# Patient Record
Sex: Female | Born: 1950 | Race: Black or African American | Hispanic: No | Smoking: Never smoker
Health system: Southern US, Community
[De-identification: ages and names within clinical notes are randomized; demographics above are authoritative.]

## PROBLEM LIST (undated history)

## (undated) DIAGNOSIS — J45909 Unspecified asthma, uncomplicated: Secondary | ICD-10-CM

## (undated) DIAGNOSIS — E079 Disorder of thyroid, unspecified: Secondary | ICD-10-CM

## (undated) DIAGNOSIS — H409 Unspecified glaucoma: Secondary | ICD-10-CM

## (undated) DIAGNOSIS — K219 Gastro-esophageal reflux disease without esophagitis: Secondary | ICD-10-CM

## (undated) DIAGNOSIS — I10 Essential (primary) hypertension: Secondary | ICD-10-CM

## (undated) DIAGNOSIS — E119 Type 2 diabetes mellitus without complications: Secondary | ICD-10-CM

---

## 1998-04-10 ENCOUNTER — Other Ambulatory Visit: Admission: RE | Admit: 1998-04-10 | Discharge: 1998-04-10 | Payer: Self-pay | Admitting: Obstetrics and Gynecology

## 1998-10-28 HISTORY — PX: OVARY SURGERY: SHX727

## 1999-03-09 ENCOUNTER — Other Ambulatory Visit: Admission: RE | Admit: 1999-03-09 | Discharge: 1999-03-09 | Payer: Self-pay | Admitting: Obstetrics and Gynecology

## 1999-04-04 ENCOUNTER — Other Ambulatory Visit: Admission: RE | Admit: 1999-04-04 | Discharge: 1999-04-04 | Payer: Self-pay | Admitting: Obstetrics and Gynecology

## 1999-04-13 ENCOUNTER — Ambulatory Visit (HOSPITAL_COMMUNITY): Admission: RE | Admit: 1999-04-13 | Discharge: 1999-04-13 | Payer: Self-pay | Admitting: Obstetrics and Gynecology

## 1999-05-22 ENCOUNTER — Encounter: Payer: Self-pay | Admitting: Obstetrics and Gynecology

## 1999-05-22 ENCOUNTER — Ambulatory Visit (HOSPITAL_COMMUNITY): Admission: RE | Admit: 1999-05-22 | Discharge: 1999-05-22 | Payer: Self-pay | Admitting: Obstetrics and Gynecology

## 1999-08-07 ENCOUNTER — Encounter (INDEPENDENT_AMBULATORY_CARE_PROVIDER_SITE_OTHER): Payer: Self-pay | Admitting: Specialist

## 1999-08-07 ENCOUNTER — Observation Stay (HOSPITAL_COMMUNITY): Admission: RE | Admit: 1999-08-07 | Discharge: 1999-08-07 | Payer: Self-pay | Admitting: Obstetrics and Gynecology

## 1999-11-22 ENCOUNTER — Encounter: Payer: Self-pay | Admitting: Obstetrics and Gynecology

## 1999-11-22 ENCOUNTER — Ambulatory Visit (HOSPITAL_COMMUNITY): Admission: RE | Admit: 1999-11-22 | Discharge: 1999-11-22 | Payer: Self-pay | Admitting: Obstetrics and Gynecology

## 2000-03-21 ENCOUNTER — Other Ambulatory Visit: Admission: RE | Admit: 2000-03-21 | Discharge: 2000-03-21 | Payer: Self-pay | Admitting: Obstetrics and Gynecology

## 2001-03-23 ENCOUNTER — Other Ambulatory Visit: Admission: RE | Admit: 2001-03-23 | Discharge: 2001-03-23 | Payer: Self-pay | Admitting: Obstetrics and Gynecology

## 2001-07-01 ENCOUNTER — Encounter: Payer: Self-pay | Admitting: Cardiology

## 2001-07-01 ENCOUNTER — Ambulatory Visit (HOSPITAL_COMMUNITY): Admission: RE | Admit: 2001-07-01 | Discharge: 2001-07-01 | Payer: Self-pay | Admitting: Cardiology

## 2002-03-24 ENCOUNTER — Other Ambulatory Visit: Admission: RE | Admit: 2002-03-24 | Discharge: 2002-03-24 | Payer: Self-pay | Admitting: Obstetrics and Gynecology

## 2002-05-21 ENCOUNTER — Encounter: Admission: RE | Admit: 2002-05-21 | Discharge: 2002-08-19 | Payer: Self-pay | Admitting: Cardiology

## 2002-10-12 ENCOUNTER — Encounter: Admission: RE | Admit: 2002-10-12 | Discharge: 2003-01-10 | Payer: Self-pay | Admitting: Cardiology

## 2003-01-24 ENCOUNTER — Encounter: Admission: RE | Admit: 2003-01-24 | Discharge: 2003-04-24 | Payer: Self-pay | Admitting: Cardiology

## 2003-03-23 ENCOUNTER — Other Ambulatory Visit: Admission: RE | Admit: 2003-03-23 | Discharge: 2003-03-23 | Payer: Self-pay | Admitting: Obstetrics and Gynecology

## 2004-06-29 ENCOUNTER — Ambulatory Visit (HOSPITAL_COMMUNITY): Admission: RE | Admit: 2004-06-29 | Discharge: 2004-06-29 | Payer: Self-pay | Admitting: Gastroenterology

## 2005-02-15 ENCOUNTER — Encounter: Admission: RE | Admit: 2005-02-15 | Discharge: 2005-02-15 | Payer: Self-pay | Admitting: Cardiology

## 2006-02-26 ENCOUNTER — Encounter: Payer: Self-pay | Admitting: Obstetrics and Gynecology

## 2007-03-18 ENCOUNTER — Ambulatory Visit (HOSPITAL_COMMUNITY): Admission: RE | Admit: 2007-03-18 | Discharge: 2007-03-18 | Payer: Self-pay | Admitting: Family Medicine

## 2007-04-08 ENCOUNTER — Encounter: Admission: RE | Admit: 2007-04-08 | Discharge: 2007-04-08 | Payer: Self-pay | Admitting: Family Medicine

## 2007-04-08 ENCOUNTER — Other Ambulatory Visit: Admission: RE | Admit: 2007-04-08 | Discharge: 2007-04-08 | Payer: Self-pay | Admitting: Interventional Radiology

## 2007-04-08 ENCOUNTER — Encounter (INDEPENDENT_AMBULATORY_CARE_PROVIDER_SITE_OTHER): Payer: Self-pay | Admitting: Interventional Radiology

## 2007-11-02 ENCOUNTER — Ambulatory Visit (HOSPITAL_COMMUNITY): Admission: RE | Admit: 2007-11-02 | Discharge: 2007-11-02 | Payer: Self-pay | Admitting: Cardiology

## 2007-11-20 ENCOUNTER — Encounter: Admission: RE | Admit: 2007-11-20 | Discharge: 2007-11-20 | Payer: Self-pay | Admitting: Gastroenterology

## 2007-11-30 ENCOUNTER — Other Ambulatory Visit: Admission: RE | Admit: 2007-11-30 | Discharge: 2007-11-30 | Payer: Self-pay | Admitting: Obstetrics and Gynecology

## 2007-11-30 IMAGING — US US BIOPSY
1 series · 4 of 4 positions shown · non-contrast
Comparison: none

CLINICAL DATA: Comparison is made with ultrasound which was performed on 03/18/07 which revealed bilateral thyroid nodules with the largest being of the isthmus measuring 1.4 cm.  Request has been made with fine needle aspiration. 
ULTRASOUND-GUIDED FINE NEEDLE ASPIRATION OF THYROID:

[Series 1: us biopsy · 4 acquisitions, 4 frames shown]
[im 1/4]
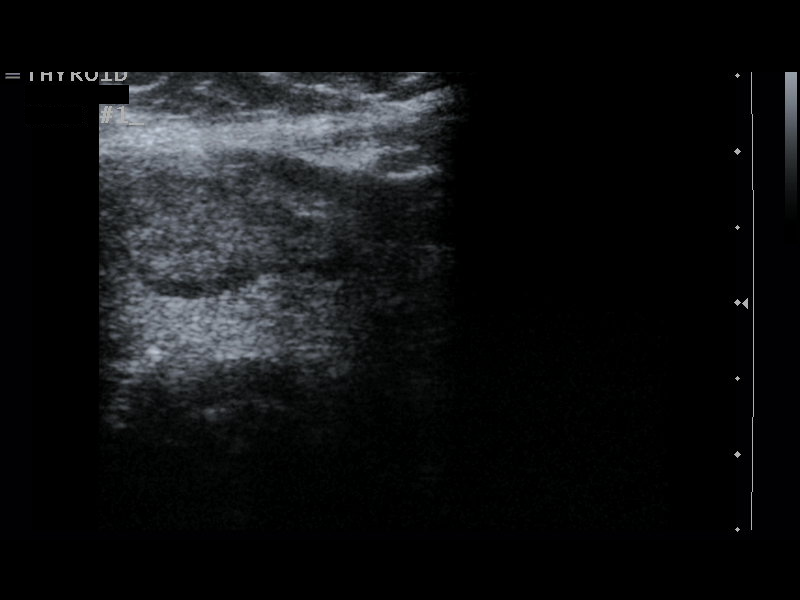
[im 2/4]
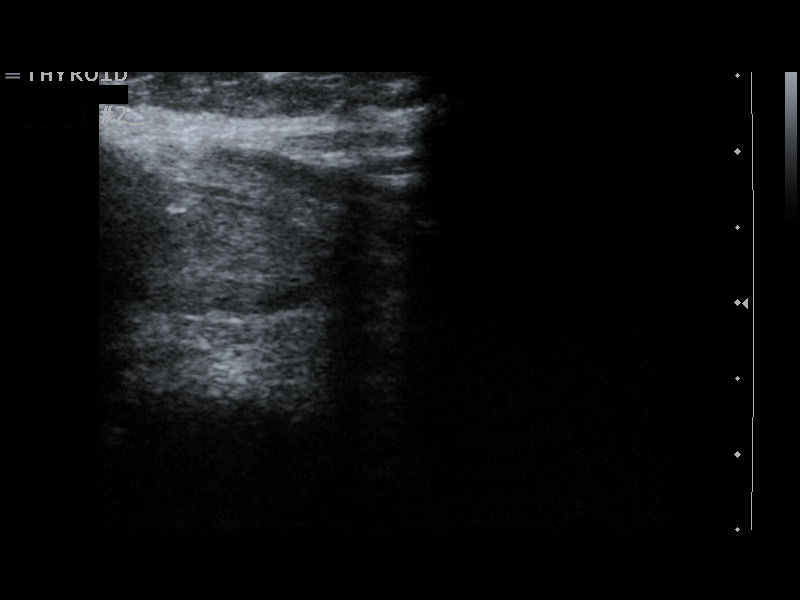
[im 3/4]
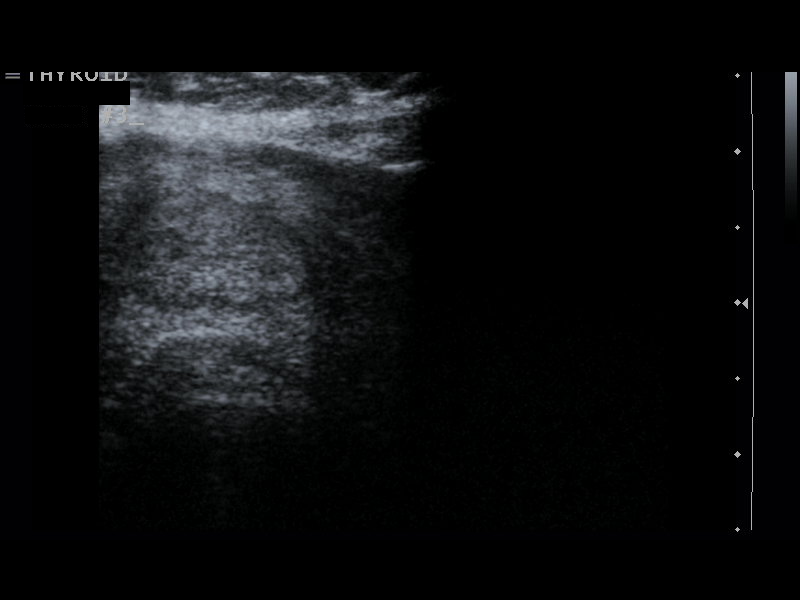
[im 4/4]
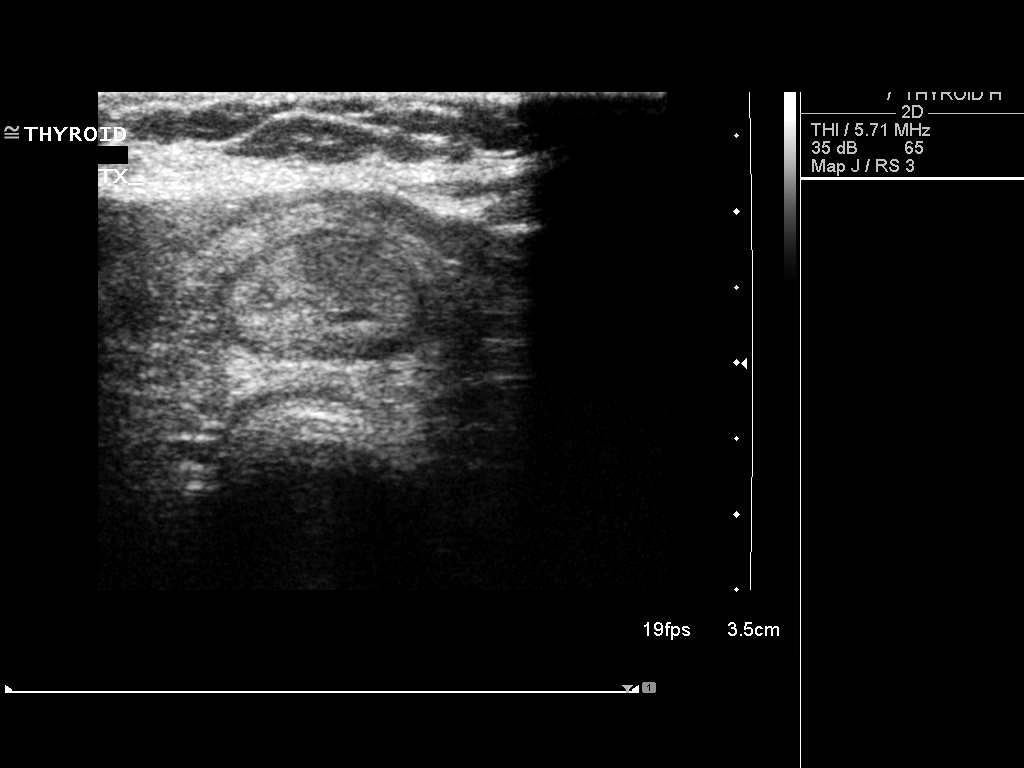

[4 of 4 positions shown; findings below may reference images not displayed]

FINDINGS: The above procedure was thoroughly discussed with the patient and written informed consent was obtained. 
Ultrasound was then performed to localize and mark an adequate site for the biopsy.  The patient was then prepped and draped in a normal sterile fashion.  1% lidocaine was used for local anesthesia.  Using direct ultrasound guidance, three passes were made using a 25 gauge hypodermic needle into the solid nodule of the isthmus portion of the thyroid.  Ultrasound confirmed placement of the needle on all three occasions.  The specimens were given to Pathology for further analysis.  Post procedure imaging demonstrated no hematoma or immediate complication.  The patient tolerated the procedure well.
IMPRESSION: Successful ultrasound-guided fine needle aspiration of isthmus nodule of the thyroid.  Final pathology pending.

## 2008-04-07 ENCOUNTER — Ambulatory Visit (HOSPITAL_COMMUNITY): Admission: RE | Admit: 2008-04-07 | Discharge: 2008-04-07 | Payer: Self-pay | Admitting: Internal Medicine

## 2008-11-29 IMAGING — US US SOFT TISSUE HEAD/NECK
1 series · 14 of 25 positions shown · non-contrast
Comparison: 11/02/2007 and 03/18/2007

CLINICAL DATA: Follow-up multinodular goiter.

THYROID ULTRASOUND
TECHNIQUE: Ultrasound examination of the thyroid gland and all
adjacent soft tissues was performed.

[Series 1: unknown · 0.11mm/px · 14 of 47 slices shown]
[im 1/47]
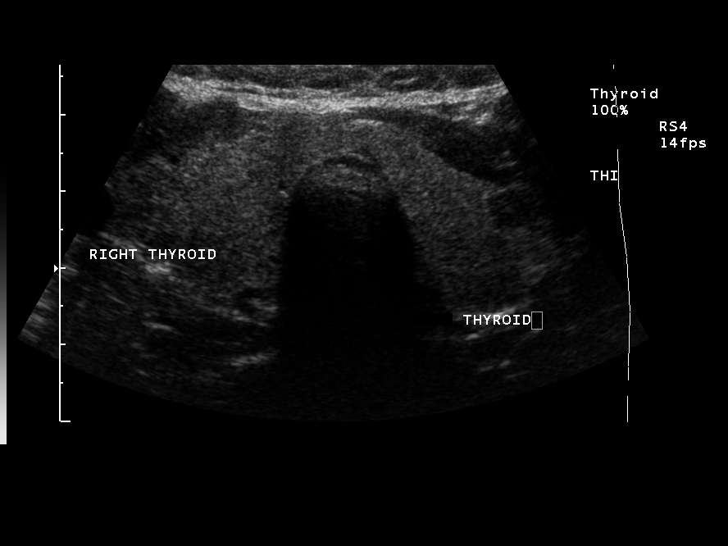
[im 4/47]
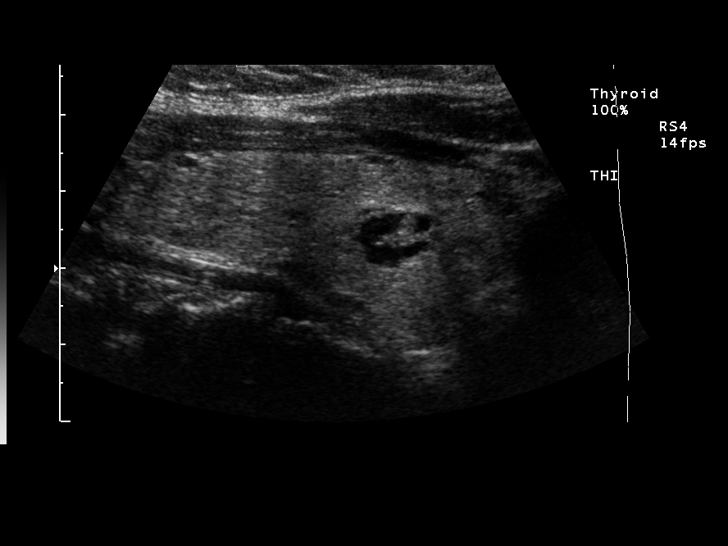
[im 8/47]
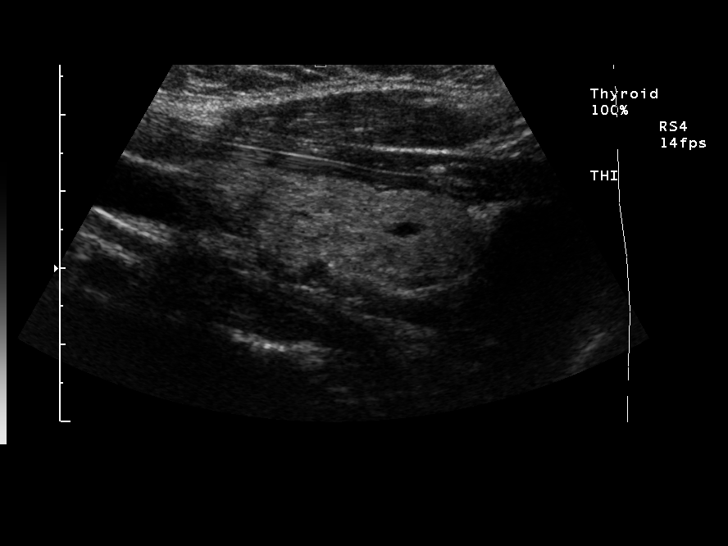
[im 12/47]
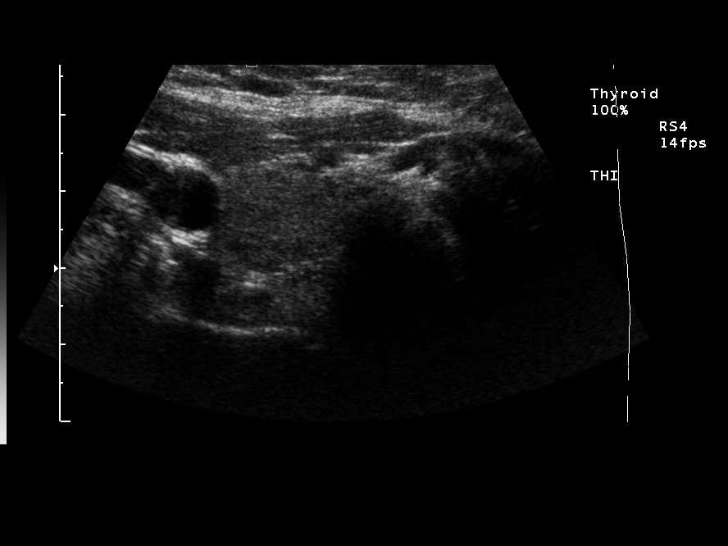
[im 16/47]
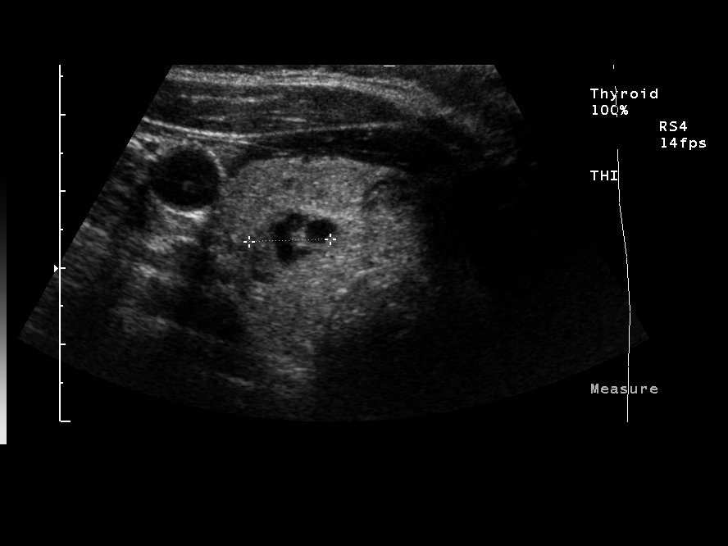
[im 18/47]
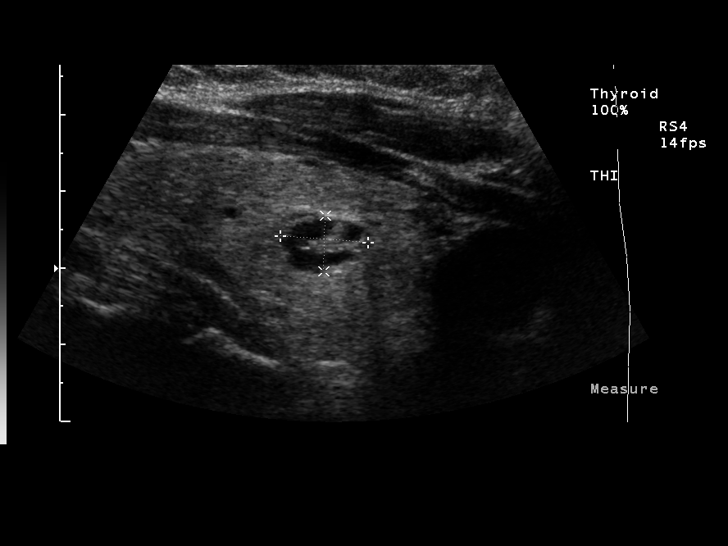
[im 22/47]
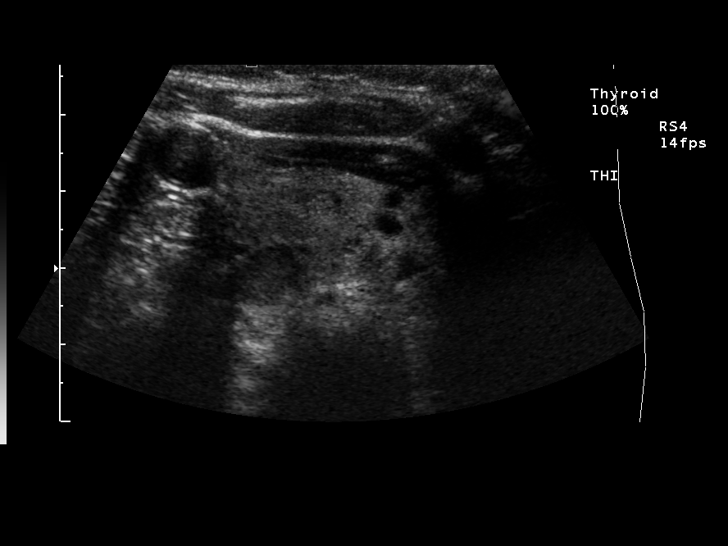
[im 25/47]
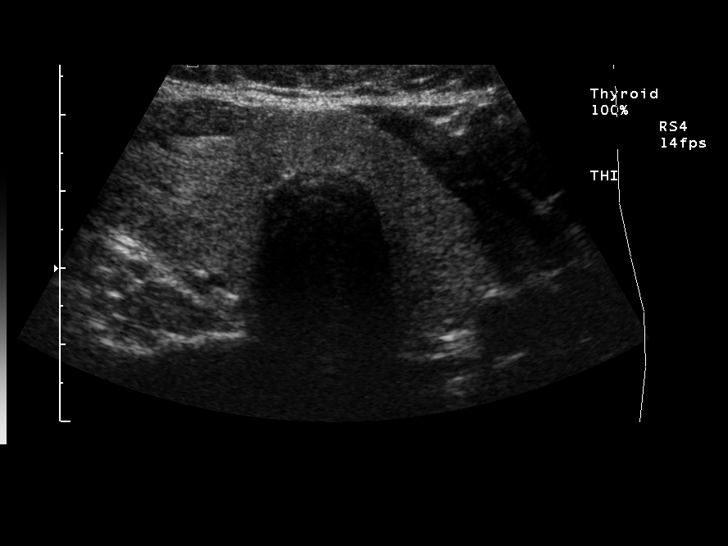
[im 29/47]
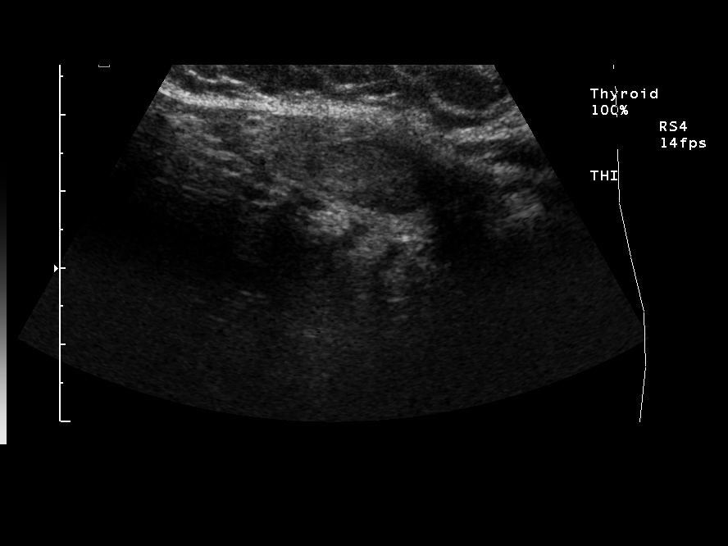
[im 31/47]
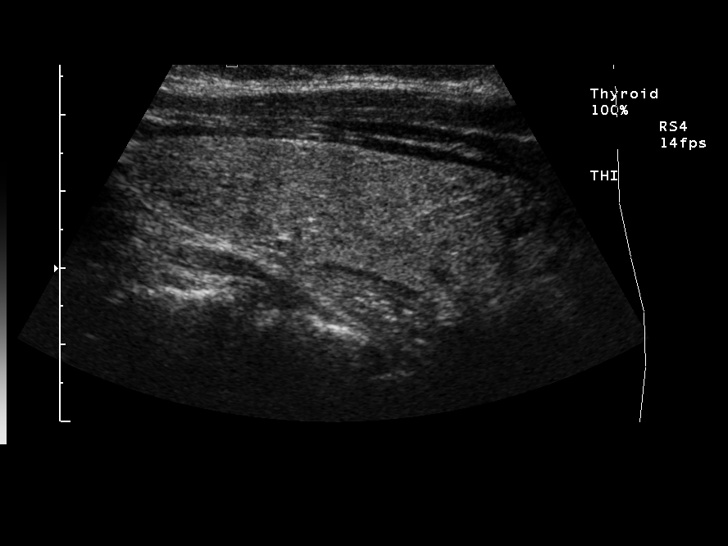
[im 35/47]
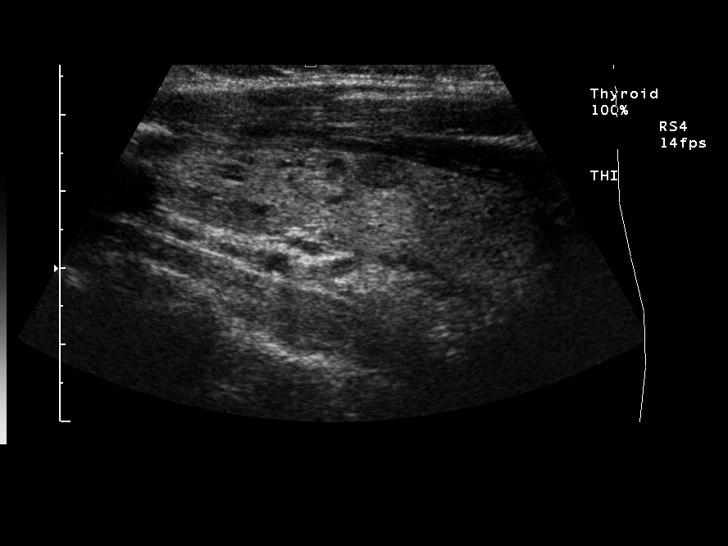
[im 39/47]
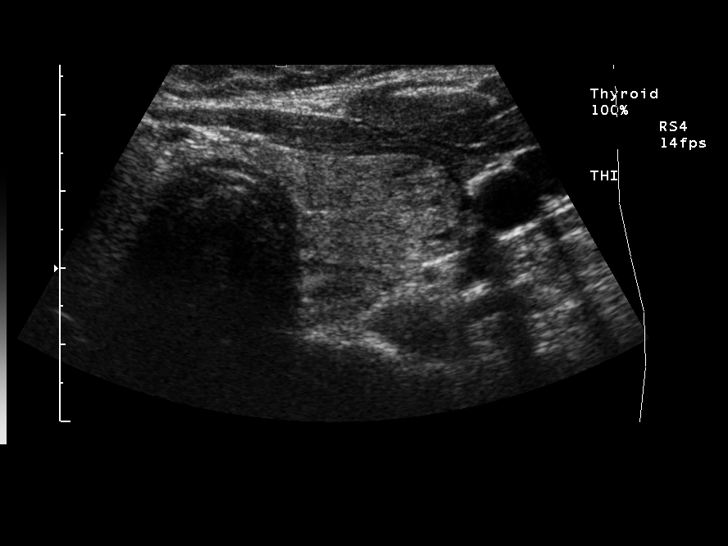
[im 43/47]
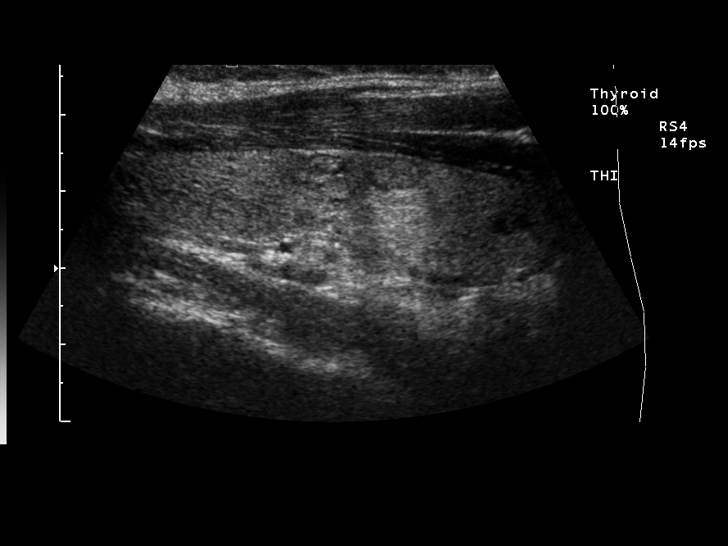
[im 47/47]
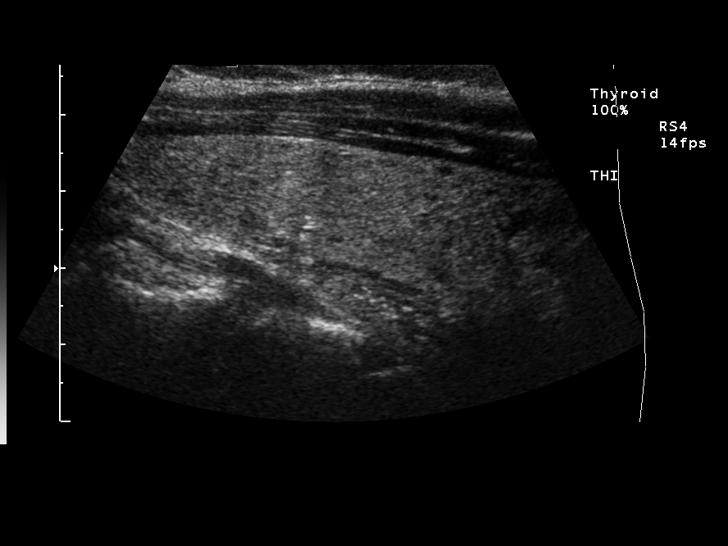

[14 of 25 positions shown; findings below may reference images not displayed]

FINDINGS: Mild goiter is seen.  The right thyroid lobe measures
x 2.0 x 2.0 cm.  The left thyroid lobe measures 5.9 x 2.2 x 2.2 cm.
The thyroid isthmus measures 8 mm in thickness.

Three solid hypoechoic nodules are seen within the inferior right
thyroid lobe measuring 0.8 x 0.9 x 0.7 cm, the mid left thyroid
lobe measuring 0.8 by 0.5 x 0.7 cm, and the thyroid isthmus
measuring 1.1 by 1.7 x 0.9 cm.  These nodules show no evidence of
microcalcifications and are homogeneous with well-defined margins.
A predominately cystic nodule seen in the mid right thyroid lobe
which measures 1.2 x 0.7 x 1.1 cm. These nodules show no
significant change in size or appearance since prior exams.
IMPRESSION: Multinodular goiter, without significant change compared to prior
exams.

## 2011-03-15 NOTE — Op Note (Signed)
NAME:  Pam Wong, Pam Wong                        ACCOUNT NO.:  [id number]   MEDICAL RECORD NO.:  [id number]                   PATIENT TYPE:  AMB   LOCATION:  ENDO                                 FACILITY:  [facility]   PHYSICIAN:  Anselmo Rod, M.D.               DATE OF BIRTH:  Aug 02, 1951   DATE OF PROCEDURE:  DATE OF DISCHARGE:                                 OPERATIVE REPORT   DATE OF PROCEDURE:  June 29, 2004.   PROCEDURE PERFORMED:  Screening colonoscopy.   ENDOSCOPIST:  Anselmo Rod, MD.   INSTRUMENT USED:  Olympus video colonoscope.   INDICATIONS FOR PROCEDURE:  A 60 year old African-American female with a  family history of colon cancer __________.  Patient undergoing screening  colonoscopy to rule out colonic polyps, masses, etc.   PRE-PROCEDURE PREPARATION:  Informed consent was procured from the patient.  The patient was fasted for 8 hours prior to the procedure and prepped with a  bottle of magnesium citrate and a gallon of GoLYTELY the night prior to the  procedure.   PRE-PROCEDURE PHYSICAL:  VITAL SIGNS:  Stable.  NECK:  Supple.  CHEST:  Clear to auscultation.  S1, S1 regular.  ABDOMEN:  Soft with normal bowel sounds.   DESCRIPTION OF PROCEDURE:  The patient was placed in the left lateral  decubitus position, sedated with 90 mg of Demerol and 10 mg of Versed in  slow incremental doses.  Once the patient was adequately sedated and  maintained on low flow oxygen and continuous cardiac monitoring, the Olympus  video colonoscope was advanced into the rectum to the cecum with great  difficulty.  The patient's position was changed from the left lateral  decubitus position and to the right lateral position, and with gentle  application of abdominal pressure reached the cecum.  The appendiceal  orifice and ileocecal valve were visualized and no masses, polyps, erosions,  ulcerations, or diverticula were seen.  Small internal hemorrhoids were  appreciated on  retroflexion in the rectum.  External hemorrhoids were seen  on anal inspection.  The patient tolerated the procedure well without  complications.  The patient had a very prolonged procedure and difficult  procedure because of her body habitus and difficulty in turning her, and  there was a significant amount of residual stool in the colon and small  lesions could have been missed.   IMPRESSION:  1.  Essentially unrevealing colonoscopy up to the cecum.  A large amount of      residual stool in the colon, small lesions could have been missed.  2.  Small internal hemorrhoids seen on retroflexion.  3.  Small external hemorrhoids seen on anal inspection.   RECOMMENDATIONS:  1.  Repeat colonoscopy has been recommended in the next 3 years unless the      patient develops any abdominal      symptoms in the interim.  2.  High fiber diet  with liberal fluid intake.  3.  Outpatient followup in the next 2 weeks for further recommendations.                                               Anselmo Rod, M.D.    JNM/MEDQ  D:  06/29/2004  T:  07/01/2004  Job:  [id number]   cc:   Hal Morales, M.D.  Fax: [phone]   Maxie Better, M.D.  [address]  [place]  [place] [address]  Fax: [phone]

## 2015-09-24 ENCOUNTER — Encounter (HOSPITAL_COMMUNITY): Payer: Self-pay | Admitting: Emergency Medicine

## 2015-09-24 ENCOUNTER — Emergency Department (HOSPITAL_COMMUNITY)
Admission: EM | Admit: 2015-09-24 | Discharge: 2015-09-25 | Disposition: A | Discharge status: Home / Self Care | Payer: Medicare PPO | Attending: Emergency Medicine | Admitting: Emergency Medicine

## 2015-09-24 DIAGNOSIS — Z8719 Personal history of other diseases of the digestive system: Secondary | ICD-10-CM | POA: Insufficient documentation

## 2015-09-24 DIAGNOSIS — J45909 Unspecified asthma, uncomplicated: Secondary | ICD-10-CM | POA: Diagnosis not present

## 2015-09-24 DIAGNOSIS — H409 Unspecified glaucoma: Secondary | ICD-10-CM | POA: Diagnosis not present

## 2015-09-24 DIAGNOSIS — R109 Unspecified abdominal pain: Secondary | ICD-10-CM

## 2015-09-24 DIAGNOSIS — Z880 Allergy status to penicillin: Secondary | ICD-10-CM | POA: Insufficient documentation

## 2015-09-24 DIAGNOSIS — F4329 Adjustment disorder with other symptoms: Secondary | ICD-10-CM | POA: Diagnosis present

## 2015-09-24 DIAGNOSIS — Z79899 Other long term (current) drug therapy: Secondary | ICD-10-CM | POA: Diagnosis not present

## 2015-09-24 DIAGNOSIS — F4325 Adjustment disorder with mixed disturbance of emotions and conduct: Secondary | ICD-10-CM | POA: Insufficient documentation

## 2015-09-24 DIAGNOSIS — E119 Type 2 diabetes mellitus without complications: Secondary | ICD-10-CM | POA: Diagnosis not present

## 2015-09-24 DIAGNOSIS — N390 Urinary tract infection, site not specified: Secondary | ICD-10-CM | POA: Insufficient documentation

## 2015-09-24 DIAGNOSIS — I10 Essential (primary) hypertension: Secondary | ICD-10-CM | POA: Insufficient documentation

## 2015-09-24 DIAGNOSIS — R451 Restlessness and agitation: Secondary | ICD-10-CM

## 2015-09-24 DIAGNOSIS — E079 Disorder of thyroid, unspecified: Secondary | ICD-10-CM | POA: Diagnosis not present

## 2015-09-24 DIAGNOSIS — Z7951 Long term (current) use of inhaled steroids: Secondary | ICD-10-CM | POA: Diagnosis not present

## 2015-09-24 HISTORY — DX: Essential (primary) hypertension: I10

## 2015-09-24 HISTORY — DX: Disorder of thyroid, unspecified: E07.9

## 2015-09-24 HISTORY — DX: Unspecified asthma, uncomplicated: J45.909

## 2015-09-24 HISTORY — DX: Type 2 diabetes mellitus without complications: E11.9

## 2015-09-24 HISTORY — DX: Gastro-esophageal reflux disease without esophagitis: K21.9

## 2015-09-24 HISTORY — DX: Unspecified glaucoma: H40.9

## 2015-09-24 LAB — CBC WITH DIFFERENTIAL/PLATELET
BASOS ABS: 0 10*3/uL (ref 0.0–0.1)
BASOS PCT: 0 %
Eosinophils Absolute: 0.1 10*3/uL (ref 0.0–0.7)
Eosinophils Relative: 2 %
HEMATOCRIT: 38.9 % (ref 36.0–46.0)
HEMOGLOBIN: 12.6 g/dL (ref 12.0–15.0)
Lymphocytes Relative: 38 %
Lymphs Abs: 3 10*3/uL (ref 0.7–4.0)
MCH: 28.1 pg (ref 26.0–34.0)
MCHC: 32.4 g/dL (ref 30.0–36.0)
MCV: 86.6 fL (ref 78.0–100.0)
Monocytes Absolute: 0.7 10*3/uL (ref 0.1–1.0)
Monocytes Relative: 9 %
NEUTROS ABS: 3.9 10*3/uL (ref 1.7–7.7)
NEUTROS PCT: 51 %
Platelets: 257 10*3/uL (ref 150–400)
RBC: 4.49 MIL/uL (ref 3.87–5.11)
RDW: 15 % (ref 11.5–15.5)
WBC: 7.7 10*3/uL (ref 4.0–10.5)

## 2015-09-24 LAB — LIPASE, BLOOD: Lipase: 26 U/L (ref 11–51)

## 2015-09-24 LAB — URINALYSIS, ROUTINE W REFLEX MICROSCOPIC
Bilirubin Urine: NEGATIVE
GLUCOSE, UA: NEGATIVE mg/dL
Hgb urine dipstick: NEGATIVE
KETONES UR: NEGATIVE mg/dL
Nitrite: NEGATIVE
PH: 6.5 (ref 5.0–8.0)
Protein, ur: NEGATIVE mg/dL
SPECIFIC GRAVITY, URINE: 1.009 (ref 1.005–1.030)

## 2015-09-24 LAB — COMPREHENSIVE METABOLIC PANEL
ALBUMIN: 3.8 g/dL (ref 3.5–5.0)
ALK PHOS: 60 U/L (ref 38–126)
ALT: 18 U/L (ref 14–54)
AST: 23 U/L (ref 15–41)
Anion gap: 8 (ref 5–15)
BILIRUBIN TOTAL: 0.5 mg/dL (ref 0.3–1.2)
BUN: 12 mg/dL (ref 6–20)
CO2: 27 mmol/L (ref 22–32)
Calcium: 9.3 mg/dL (ref 8.9–10.3)
Chloride: 105 mmol/L (ref 101–111)
Creatinine, Ser: 0.8 mg/dL (ref 0.44–1.00)
GFR calc Af Amer: >60 mL/min (ref >60)
GFR calc non Af Amer: >60 mL/min (ref >60)
GLUCOSE: 106 mg/dL — AB (ref 65–99)
POTASSIUM: 3.6 mmol/L (ref 3.5–5.1)
Sodium: 140 mmol/L (ref 135–145)
TOTAL PROTEIN: 7 g/dL (ref 6.5–8.1)

## 2015-09-24 LAB — URINE MICROSCOPIC-ADD ON: RBC / HPF: NONE SEEN RBC/hpf (ref 0–5)
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
## 2015-09-24 NOTE — BH Assessment (Signed)
Consulted Alberteen SamFran Hobson, NP who recommended that pt be evaluated by psychiatry in the morning. Informed Melburn HakeNicole Nadeau, PA-C of recommendation.

## 2015-09-24 NOTE — ED Notes (Signed)
RN is going to get labs.
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
## 2015-09-24 NOTE — ED Notes (Signed)
Bed: WA25 Expected date:  Expected time:  Means of arrival:  Comments: EMS/abd. pain 

## 2015-09-24 NOTE — ED Provider Notes (Signed)
CSN: [id number]     Arrival date & time 09/24/15  1920 History   First MD Initiated Contact with Patient 09/24/15 1923     Chief Complaint  Patient presents with  . Agitation  . Abdominal Pain     (Consider location/radiation/quality/duration/timing/severity/associated sxs/prior Treatment) Patient is a 64 y.o. female presenting with abdominal pain.  Abdominal Pain Associated symptoms: nausea     Asian is a 64 year old female past medical history of hypertension diabetes who presents to the ED via EMS with complaint of left upper abdominal pain, onset 4 PM. Patient reports while she was driving to go visit family she began having sharp constant left upper quadrant pain, pain worse with movement. Endorses associated nausea. She reports she had a nondairy yogurt with fruit which relieved her nausea. Denies fever, chills, SOB, CP, vomiting, diarrhea, urinary symptoms, blood in urine or stool. Patient reports she is lactose intolerant and notes she had coffee with milk/cream earlier this afternoon. She reports this pain feels similar to when she has consumed dairy products in the past but she notes she typically will have pain in the epigastric region.   EMS report they were called because the patient got into an argument with her son at [employer] which resulted in him calling 911 due to his mother acting psychotic. Patient reports that her and her son do not get along very well and she notes she has not seen him in a while. She reports they got into an argument which resulted in her son getting upset and calling 911. Pt denies SI/HI or hallucinations.   Past Medical History  Diagnosis Date  . Hypertension   . Asthma   . Diabetes mellitus without complication (HCC)   . Glaucoma (increased eye pressure)   . Thyroid disease   . GERD (gastroesophageal reflux disease)    Past Surgical History  Procedure Laterality Date  . Ovary surgery Right 2000   History reviewed. No pertinent family  history. Social History  Substance Use Topics  . Smoking status: Never Smoker   . Smokeless tobacco: Never Used  . Alcohol Use: Yes     Comment: socially   OB History    No data available     Review of Systems  Gastrointestinal: Positive for nausea and abdominal pain.  All other systems reviewed and are negative.     Allergies  Codeine and Penicillins  Home Medications   Prior to Admission medications   Medication Sig Start Date End Date Taking? Authorizing Provider  Aspirin-Salicylamide-Caffeine (BC HEADACHE POWDER PO) Take 1 packet by mouth daily as needed (headache).   Yes Historical Provider, MD  Azelastine-Fluticasone (DYMISTA NA) Place 1 spray into the nose daily.   Yes Historical Provider, MD  budesonide-formoterol (SYMBICORT) 160-4.5 MCG/ACT inhaler Inhale 2 puffs into the lungs 2 (two) times daily.   Yes Historical Provider, MD  COD LIVER OIL PO Take 2 capsules by mouth 2 (two) times daily.   Yes Historical Provider, MD  Cyanocobalamin (B-12 PO) Take 1 tablet by mouth daily.   Yes Historical Provider, MD  diclofenac (VOLTAREN) 75 MG EC tablet Take 75 mg by mouth daily as needed for mild pain.   Yes Historical Provider, MD  dorzolamide (TRUSOPT) 2 % ophthalmic solution Place 1 drop into both eyes 2 (two) times daily.   Yes Historical Provider, MD  fexofenadine (ALLEGRA) 60 MG tablet Take 60 mg by mouth daily as needed for allergies or rhinitis.   Yes Historical Provider, MD  ibuprofen (  ADVIL,MOTRIN) 200 MG tablet Take 800 mg by mouth every 6 (six) hours as needed for headache or moderate pain.   Yes Historical Provider, MD  levothyroxine (SYNTHROID, LEVOTHROID) 150 MCG tablet Take 150 mcg by mouth daily before breakfast.   Yes Historical Provider, MD  montelukast (SINGULAIR) 10 MG tablet Take 10 mg by mouth at bedtime.   Yes Historical Provider, MD  Oxyquinolone Sulfate (TRIMO-SAN VA) Place 1 Applicatorful vaginally daily as needed.   Yes Historical Provider, MD   Probiotic Product (PROBIOTIC PO) Take 1 capsule by mouth daily.   Yes Historical Provider, MD  Triamcinolone Acetonide (NASACORT AQ NA) Place 2 sprays into the nose 2 (two) times daily as needed (allergies).   Yes Historical Provider, MD  valsartan-hydrochlorothiazide (DIOVAN-HCT) 160-25 MG tablet Take 1 tablet by mouth daily.   Yes Historical Provider, MD  vitamin C (ASCORBIC ACID) 500 MG tablet Take 500 mg by mouth daily.   Yes Historical Provider, MD   BP 140/72 mmHg  Pulse 85  Resp 16  SpO2 93% Physical Exam  Constitutional: She is oriented to person, place, and time. She appears well-developed and well-nourished. No distress.  HENT:  Head: Normocephalic and atraumatic.  Mouth/Throat: Oropharynx is clear and moist. No oropharyngeal exudate.  Eyes: Conjunctivae and EOM are normal. Right eye exhibits no discharge. Left eye exhibits no discharge. No scleral icterus.  Neck: Normal range of motion. Neck supple.  Cardiovascular: Normal rate, regular rhythm, normal heart sounds and intact distal pulses.   Pulmonary/Chest: Effort normal and breath sounds normal. No respiratory distress. She has no wheezes. She has no rales. She exhibits no tenderness.  Abdominal: Soft. Bowel sounds are normal. She exhibits no distension and no mass. There is tenderness in the left upper quadrant. There is no rigidity, no rebound, no guarding and no CVA tenderness.  Mild diffuse tenderness throughout with moderate tenderness in LUQ.  Musculoskeletal: She exhibits no edema.  Lymphadenopathy:    She has no cervical adenopathy.  Neurological: She is alert and oriented to person, place, and time.  Skin: Skin is warm and dry.  Nursing note and vitals reviewed.   ED Course  Procedures (including critical care time) Labs Review Labs Reviewed  COMPREHENSIVE METABOLIC PANEL - Abnormal; Notable for the following:    Glucose, Bld 106 (*)    All other components within normal limits  URINALYSIS, ROUTINE W REFLEX  MICROSCOPIC (NOT AT [facility]) - Abnormal; Notable for the following:    APPearance CLOUDY (*)    Leukocytes, UA SMALL (*)    All other components within normal limits  URINE MICROSCOPIC-ADD ON - Abnormal; Notable for the following:    Squamous Epithelial / LPF 6-30 (*)    Bacteria, UA FEW (*)    All other components within normal limits  CBC WITH DIFFERENTIAL/PLATELET  LIPASE, BLOOD    Imaging Review No results found. I have personally reviewed and evaluated these images and lab results as part of my medical decision-making.  Filed Vitals:   09/24/15 1925 09/24/15 2344  BP: 166/70 140/72  Pulse: 68 85  Resp: 16 16     MDM   Final diagnoses:  Abdominal pain, unspecified abdominal location  Agitation    Pt presents with abdominal pain and nausea that has since improved. Hx of lactose intolerance and reports drinking coffee with milk/cream in it this morning. EMS report they were called due to pt getting into an argument with her son, son reports pt was being "psychotic". Pt denies SI/HI  or hallucinations. VSS. Exam revealed mild diffuse tenderness, worse in LUQ, no peritoneal signs. Labs unremarkable. Pt reports her pain has improved since arrival to the ED. I suspect pt's sxs are likely due to consumption of dairy products and do not feel that any further workup or imaging is warranted at this time. Discussed results with pt.  Police officer came to the ED to serve IVC papers to pt. Consulted TTS who advised that pt will be evaluated by psych in the morning.     Satira Sarkicole Elizabeth WhitakersNadeau, [place] JerseyPA-C 09/25/15 [id number]  Mirian MoMatthew Gentry, MD 09/25/15 [phone]

## 2015-09-24 NOTE — ED Notes (Addendum)
Pt belongings: black pants, black socks, beige sweater, green jacket, earings, necklace, ring, pocket book locker 27

## 2015-09-24 NOTE — ED Notes (Signed)
Emergency planning/management officerolice officer at bedside serving [employer] papers.

## 2015-09-25 DIAGNOSIS — F4329 Adjustment disorder with other symptoms: Secondary | ICD-10-CM

## 2015-09-25 DIAGNOSIS — N390 Urinary tract infection, site not specified: Secondary | ICD-10-CM

## 2015-09-25 MED ORDER — SULFAMETHOXAZOLE-TRIMETHOPRIM 800-160 MG PO TABS: 1.0000 | ORAL_TABLET | Freq: Two times a day (BID) | ORAL | Status: AC

## 2015-09-25 MED ORDER — SULFAMETHOXAZOLE-TRIMETHOPRIM 800-160 MG PO TABS
1.0000 | ORAL_TABLET | Freq: Two times a day (BID) | ORAL | Status: DC
  Administered 2015-09-25: 1 via ORAL
  Filled 2015-09-25: qty 1

## 2015-09-25 NOTE — ED Notes (Signed)
Pt in shower.  

## 2015-09-25 NOTE — ED Notes (Signed)
Pt up brushing teeth. Sitter at bedside

## 2015-09-25 NOTE — ED Notes (Signed)
Pt given cranberry juice and apple sauce per request.

## 2015-09-25 NOTE — Consult Note (Signed)
Pam Wong   Reason for Wong:  Confusion Referring Physician:  EDP Patient Identification: Pam Wong MRN:  [id number] Principal Diagnosis: Urinary tract infection Diagnosis:   Patient Active Problem List   Diagnosis Date Noted  . 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 Urinary tract infection [N39.0] 09/25/2015    Priority: High    Total Time spent with patient: 45 minutes  Subjective:   Pam Wong is a 64 y.o. female patient does not warrant admission.  HPI:  63 yo female IVC'd by her son for confusion, no past psychiatric history or medications.  She reports she was having abdominal pain and had to stop her car.  She reports calling her son who called EMS.  [name] had a UTI, only change noted.  On assessment, she was clear and coherent:  Knew the past 4 presidents, Place/Time/Situation, the floor of the hospital, etc.  She was visiting her son for his birthday on Thursday but needs to return for work, gave verbal directions to PA without being asked.  Received counseling in Jan 15, 2011 after her husband died, no medications or hospitalization.  Denies suicidal/homicidal ideations, hallucinations, and alcohol/drug abuse. Stable for discharge.  Past Psychiatric History: None  Risk to Self: Suicidal Ideation: No Suicidal Intent: No Is patient at risk for suicide?: No Suicidal Plan?: No Access to Means: No What has been your use of drugs/alcohol within the last 12 months?: No alcohol or drug use reported.  How many times?: 0 Other Self Harm Risks: N/A Triggers for Past Attempts: None known Intentional Self Injurious Behavior: None Risk to Others: Homicidal Ideation: No Thoughts of Harm to Others: No Current Homicidal Intent: No Current Homicidal Plan: No Access to Homicidal Means: No Identified Victim: N/A History of harm to others?: No Assessment of Violence: None Noted Violent Behavior  Description: No violent behaviors observed.  Does patient have access to weapons?: No Criminal Charges Pending?: No Does patient have a court date: No Prior Inpatient Therapy: Prior Inpatient Therapy: No Prior Outpatient Therapy: Prior Outpatient Therapy: Yes Prior Therapy Dates: 01/15/2011 Prior Therapy Facilty/Provider(s): Pt unable to recall Reason for Treatment: Anxiety  Does patient have an ACCT team?: No Does patient have Intensive In-House Services?  : No Does patient have [facility] services? : No Does patient have [facility] services?: No  Past Medical History:  Past Medical History  Diagnosis Date  . Hypertension   . Asthma   . Diabetes mellitus without complication ([place])   . Glaucoma (increased eye pressure)   . Thyroid disease   . GERD (gastroesophageal reflux disease)     Past Surgical History  Procedure Laterality Date  . Ovary surgery Right 01/15/1999   Family History: History reviewed. No pertinent family history. Family Psychiatric  History: None Social History:  History  Alcohol Use  . Yes    Comment: socially     History  Drug Use No    Social History   Social History  . Marital Status: Widowed    Spouse Name: N/A  . Number of Children: N/A  . Years of Education: N/A   Social History Main Topics  . Smoking status: Never Smoker   . Smokeless tobacco: Never Used  . Alcohol Use: Yes     Comment: socially  . Drug Use: No  . Sexual Activity: Not Asked   Other Topics Concern  . None   Social History Narrative  . None   Additional Social  History:    History of alcohol / drug use?: No history of alcohol / drug abuse                     Allergies:   Allergies  Allergen Reactions  . Codeine Nausea Only  . Penicillins Rash    Has patient had a PCN reaction causing immediate rash, facial/tongue/throat swelling, SOB or lightheadedness with hypotension: Yes Has patient had a PCN reaction causing severe rash involving mucus membranes or skin necrosis:  No Has patient had a PCN reaction that required hospitalization No Has patient had a PCN reaction occurring within the last 10 years: No If all of the above answers are "NO", then may proceed with Cephalosporin use.     Labs:  Results for orders placed or performed during the hospital encounter of 09/24/15 (from the past 48 hour(s))  Urinalysis, Routine w reflex microscopic     Status: Abnormal   Collection Time: 09/24/15  8:14 PM  Result Value Ref Range   Color, Urine YELLOW YELLOW   APPearance CLOUDY (A) CLEAR   Specific Gravity, Urine 1.009 1.005 - 1.030   pH 6.5 5.0 - 8.0   Glucose, UA NEGATIVE NEGATIVE mg/dL   Hgb urine dipstick NEGATIVE NEGATIVE   Bilirubin Urine NEGATIVE NEGATIVE   Ketones, ur NEGATIVE NEGATIVE mg/dL   Protein, ur NEGATIVE NEGATIVE mg/dL   Nitrite NEGATIVE NEGATIVE   Leukocytes, UA SMALL (A) NEGATIVE  Urine microscopic-add on     Status: Abnormal   Collection Time: 09/24/15  8:14 PM  Result Value Ref Range   Squamous Epithelial / LPF 6-30 (A) NONE SEEN    Comment: Please note change in reference range.   WBC, UA 0-5 0 - 5 WBC/hpf    Comment: Please note change in reference range.   RBC / HPF NONE SEEN 0 - 5 RBC/hpf    Comment: Please note change in reference range.   Bacteria, UA FEW (A) NONE SEEN    Comment: Please note change in reference range.  CBC with Differential     Status: None   Collection Time: 09/24/15  9:03 PM  Result Value Ref Range   WBC 7.7 4.0 - 10.5 K/uL   RBC 4.49 3.87 - 5.11 MIL/uL   Hemoglobin 12.6 12.0 - 15.0 g/dL   HCT 38.9 36.0 - 46.0 %   MCV 86.6 78.0 - 100.0 fL   MCH 28.1 26.0 - 34.0 pg   MCHC 32.4 30.0 - 36.0 g/dL   RDW 15.0 11.5 - 15.5 %   Platelets 257 150 - 400 K/uL   Neutrophils Relative % 51 %   Neutro Abs 3.9 1.7 - 7.7 K/uL   Lymphocytes Relative 38 %   Lymphs Abs 3.0 0.7 - 4.0 K/uL   Monocytes Relative 9 %   Monocytes Absolute 0.7 0.1 - 1.0 K/uL   Eosinophils Relative 2 %   Eosinophils Absolute 0.1 0.0 -  0.7 K/uL   Basophils Relative 0 %   Basophils Absolute 0.0 0.0 - 0.1 K/uL  Comprehensive metabolic panel     Status: Abnormal   Collection Time: 09/24/15  9:03 PM  Result Value Ref Range   Sodium 140 135 - 145 mmol/L   Potassium 3.6 3.5 - 5.1 mmol/L   Chloride 105 101 - 111 mmol/L   CO2 27 22 - 32 mmol/L   Glucose, Bld 106 (H) 65 - 99 mg/dL   BUN 12 6 - 20 mg/dL   Creatinine, Ser 0.80  0.44 - 1.00 mg/dL   Calcium 9.3 8.9 - 10.3 mg/dL   Total Protein 7.0 6.5 - 8.1 g/dL   Albumin 3.8 3.5 - 5.0 g/dL   AST 23 15 - 41 U/L   ALT 18 14 - 54 U/L   Alkaline Phosphatase 60 38 - 126 U/L   Total Bilirubin 0.5 0.3 - 1.2 mg/dL   GFR calc non Af Amer >60 >60 mL/min   GFR calc Af Amer >60 >60 mL/min    Comment: (NOTE) The eGFR has been calculated using the CKD EPI equation. This calculation has not been validated in all clinical situations. eGFR's persistently <60 mL/min signify possible Chronic Kidney Disease.    Anion gap 8 5 - 15  Lipase, blood     Status: None   Collection Time: 09/24/15  9:03 PM  Result Value Ref Range   Lipase 26 11 - 51 U/L    Current Facility-Administered Medications  Medication Dose Route Frequency Provider Last Rate Last Dose  . sulfamethoxazole-trimethoprim (BACTRIM DS,SEPTRA DS) 800-160 MG per tablet 1 tablet  1 tablet Oral Q12H Patrecia Pour, NP       Current Outpatient Prescriptions  Medication Sig Dispense Refill  . Aspirin-Salicylamide-Caffeine (BC HEADACHE POWDER PO) Take 1 packet by mouth daily as needed (headache).    . Azelastine-Fluticasone (DYMISTA NA) Place 1 spray into the nose daily.    . budesonide-formoterol (SYMBICORT) 160-4.5 MCG/ACT inhaler Inhale 2 puffs into the lungs 2 (two) times daily.    . COD LIVER OIL PO Take 2 capsules by mouth 2 (two) times daily.    . Cyanocobalamin (B-12 PO) Take 1 tablet by mouth daily.    . diclofenac (VOLTAREN) 75 MG EC tablet Take 75 mg by mouth daily as needed for mild pain.    . dorzolamide (TRUSOPT) 2 %  ophthalmic solution Place 1 drop into both eyes 2 (two) times daily.    . fexofenadine (ALLEGRA) 60 MG tablet Take 60 mg by mouth daily as needed for allergies or rhinitis.    Marland Kitchen ibuprofen (ADVIL,MOTRIN) 200 MG tablet Take 800 mg by mouth every 6 (six) hours as needed for headache or moderate pain.    Marland Kitchen levothyroxine (SYNTHROID, LEVOTHROID) 150 MCG tablet Take 150 mcg by mouth daily before breakfast.    . montelukast (SINGULAIR) 10 MG tablet Take 10 mg by mouth at bedtime.    . Oxyquinolone Sulfate (TRIMO-SAN VA) Place 1 Applicatorful vaginally daily as needed.    . Probiotic Product (PROBIOTIC PO) Take 1 capsule by mouth daily.    . Triamcinolone Acetonide (NASACORT AQ NA) Place 2 sprays into the nose 2 (two) times daily as needed (allergies).    . valsartan-hydrochlorothiazide (DIOVAN-HCT) 160-25 MG tablet Take 1 tablet by mouth daily.    . vitamin C (ASCORBIC ACID) 500 MG tablet Take 500 mg by mouth daily.      Musculoskeletal: Strength & Muscle Tone: within normal limits Gait & Station: normal Patient leans: N/A  Psychiatric Specialty Exam: Review of Systems  Constitutional: Negative.   HENT: Negative.   Eyes: Negative.   Respiratory: Negative.   Cardiovascular: Negative.   Gastrointestinal: Negative.   Genitourinary: Negative.   Musculoskeletal: Negative.   Skin: Negative.   Neurological: Negative.   Endo/Heme/Allergies: Negative.   Psychiatric/Behavioral:       Negative    Blood pressure 137/90, pulse 83, temperature 98.1 F (36.7 C), temperature source Oral, resp. rate 18, SpO2 98 %.There is no height or weight on file to  calculate BMI.  General Appearance: Casual  Eye Contact::  Good  Speech:  Normal Rate  Volume:  Normal  Mood:  Euthymic  Affect:  Congruent  Thought Process:  Coherent  Orientation:  Full (Time, Place, and Person)  Thought Content:  WDL  Suicidal Thoughts:  No  Homicidal Thoughts:  No  Memory:  Immediate;   Good Recent;   Good Remote;   Good   Judgement:  Good  Insight:  Good  Psychomotor Activity:  Normal  Concentration:  Good  Recall:  Good  Fund of Knowledge:Good  Language: Good  Akathisia:  No  Handed:  Right  AIMS (if indicated):     Assets:  Leisure Time Physical Health Resilience Social Support  ADL's:  Intact  Cognition: WNL  Sleep:      Treatment Plan Summary: Daily contact with patient to assess and evaluate symptoms and progress in treatment, Medication management and Plan Adjustment disorder with distubance of emotions: -Crisis stabilization -Individual counseling -Rx for UTI, Septra due to allergy to PCN and drug interactions with other antibiotics and her home medications  Disposition: No evidence of imminent risk to self or others at present.    Waylan Boga, [place] 09/25/2015 11:25 AM Patient seen face-to-face for psychiatric evaluation, chart reviewed and case discussed with the physician extender and developed treatment plan. Reviewed the information documented and agree with the treatment plan. Corena Pilgrim, MD

## 2015-09-25 NOTE — Discharge Instructions (Signed)

## 2015-09-25 NOTE — [facility] Suicide Risk Assessment (Signed)
Suicide Risk Assessment  Discharge Assessment   [facility]   Demographic Factors:  Age 66 or older  Total Time spent with patient: 45 minutes  Musculoskeletal: Strength & Muscle Tone: within normal limits Gait & Station: normal Patient leans: N/A  Psychiatric Specialty Exam: Review of Systems  Constitutional: Negative.   HENT: Negative.   Eyes: Negative.   Respiratory: Negative.   Cardiovascular: Negative.   Gastrointestinal: Negative.   Genitourinary: Negative.   Musculoskeletal: Negative.   Skin: Negative.   Neurological: Negative.   Endo/Heme/Allergies: Negative.   Psychiatric/Behavioral:       Negative    Blood pressure 137/90, pulse 83, temperature 98.1 F (36.7 C), temperature source Oral, resp. rate 18, SpO2 98 %.There is no height or weight on file to calculate BMI.  General Appearance: Casual  Eye Contact::  Good  Speech:  Normal Rate  Volume:  Normal  Mood:  Euthymic  Affect:  Congruent  Thought Process:  Coherent  Orientation:  Full (Time, Place, and Person)  Thought Content:  WDL  Suicidal Thoughts:  No  Homicidal Thoughts:  No  Memory:  Immediate;   Good Recent;   Good Remote;   Good  Judgement:  Good  Insight:  Good  Psychomotor Activity:  Normal  Concentration:  Good  Recall:  Good  Fund of Knowledge:Good  Language: Good  Akathisia:  No  Handed:  Right  AIMS (if indicated):     Assets:  Leisure Time Physical Health Resilience Social Support  ADL's:  Intact  Cognition: WNL  Sleep:          Has this patient used any form of tobacco in the last 30 days? (Cigarettes, Smokeless Tobacco, Cigars, and/or Pipes) No  Mental Status Per Nursing Assessment::   On Admission:     Current Mental Status by Physician: NA  Loss Factors: NA  Historical Factors: NA  Risk Reduction Factors:   Sense of responsibility to family, Employed, Positive social support and Positive coping skills or problem solving  skills  Continued Clinical Symptoms:  None  Cognitive Features That Contribute To Risk:  None    Suicide Risk:  Minimal: No identifiable suicidal ideation.  Patients presenting with no risk factors but with morbid ruminations; may be classified as minimal risk based on the severity of the depressive symptoms  Principal Problem: Urinary tract infection Discharge Diagnoses:  Patient Active Problem List   Diagnosis Date Noted  . 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 Urinary tract infection [N39.0] 09/25/2015    Priority: High      Plan Of Care/Follow-up recommendations:  Activity:  as tolerated Diet:  heart healthy diet  Is patient on multiple antipsychotic therapies at discharge:  No   Has Patient had three or more failed trials of antipsychotic monotherapy by history:  No  Recommended Plan for Multiple Antipsychotic Therapies: NA    LORD, JAMISON, PMH-NP 09/25/2015, 4:18 PM
